# Patient Record
Sex: Female | Born: 1955 | Race: White | Hispanic: No | Marital: Married | Smoking: Never smoker
Health system: Southern US, Community
[De-identification: ages and names within clinical notes are randomized; demographics above are authoritative.]

## PROBLEM LIST (undated history)

## (undated) HISTORY — PX: TUBAL LIGATION: SHX77

---

## 1999-03-27 ENCOUNTER — Ambulatory Visit (HOSPITAL_COMMUNITY): Admission: RE | Admit: 1999-03-27 | Discharge: 1999-03-27 | Payer: Self-pay | Admitting: Obstetrics & Gynecology

## 1999-03-27 ENCOUNTER — Encounter (INDEPENDENT_AMBULATORY_CARE_PROVIDER_SITE_OTHER): Payer: Self-pay | Admitting: Specialist

## 2000-04-13 ENCOUNTER — Other Ambulatory Visit: Admission: RE | Admit: 2000-04-13 | Discharge: 2000-04-13 | Payer: Self-pay | Admitting: Obstetrics & Gynecology

## 2001-06-16 ENCOUNTER — Other Ambulatory Visit: Admission: RE | Admit: 2001-06-16 | Discharge: 2001-06-16 | Payer: Self-pay | Admitting: Obstetrics & Gynecology

## 2002-05-16 ENCOUNTER — Other Ambulatory Visit: Admission: RE | Admit: 2002-05-16 | Discharge: 2002-05-16 | Payer: Self-pay | Admitting: Obstetrics & Gynecology

## 2003-05-04 ENCOUNTER — Other Ambulatory Visit: Admission: RE | Admit: 2003-05-04 | Discharge: 2003-05-04 | Payer: Self-pay | Admitting: Obstetrics & Gynecology

## 2004-06-03 ENCOUNTER — Other Ambulatory Visit: Admission: RE | Admit: 2004-06-03 | Discharge: 2004-06-03 | Payer: Self-pay | Admitting: Obstetrics & Gynecology

## 2005-06-07 ENCOUNTER — Other Ambulatory Visit: Admission: RE | Admit: 2005-06-07 | Discharge: 2005-06-07 | Payer: Self-pay | Admitting: Obstetrics & Gynecology

## 2009-11-18 ENCOUNTER — Encounter: Admission: RE | Admit: 2009-11-18 | Discharge: 2009-11-18 | Payer: Self-pay | Admitting: Obstetrics and Gynecology

## 2009-12-15 ENCOUNTER — Ambulatory Visit (HOSPITAL_COMMUNITY): Admission: RE | Admit: 2009-12-15 | Discharge: 2009-12-15 | Payer: Self-pay | Admitting: Obstetrics & Gynecology

## 2010-12-02 ENCOUNTER — Other Ambulatory Visit: Payer: Self-pay | Admitting: Obstetrics & Gynecology

## 2010-12-14 LAB — CBC
HCT: 42.2 % (ref 36.0–46.0)
RBC: 4.75 MIL/uL (ref 3.87–5.11)
WBC: 6.2 10*3/uL (ref 4.0–10.5)

## 2015-01-21 ENCOUNTER — Other Ambulatory Visit: Payer: Self-pay

## 2015-01-23 LAB — CYTOLOGY - PAP

## 2017-06-21 ENCOUNTER — Other Ambulatory Visit: Payer: Self-pay | Admitting: Family Medicine

## 2017-06-21 ENCOUNTER — Ambulatory Visit
Admission: RE | Admit: 2017-06-21 | Discharge: 2017-06-21 | Disposition: A | Discharge status: Home / Self Care | Payer: BC Managed Care – PPO | Source: Ambulatory Visit | Attending: Family Medicine | Admitting: Family Medicine

## 2017-06-21 DIAGNOSIS — M25562 Pain in left knee: Secondary | ICD-10-CM

## 2017-09-21 ENCOUNTER — Other Ambulatory Visit: Payer: Self-pay

## 2017-09-21 ENCOUNTER — Encounter (HOSPITAL_BASED_OUTPATIENT_CLINIC_OR_DEPARTMENT_OTHER): Payer: Self-pay

## 2017-09-21 ENCOUNTER — Emergency Department (HOSPITAL_BASED_OUTPATIENT_CLINIC_OR_DEPARTMENT_OTHER)
Admission: EM | Admit: 2017-09-21 | Discharge: 2017-09-22 | Disposition: A | Discharge status: Home / Self Care | Payer: BC Managed Care – PPO | Attending: Emergency Medicine | Admitting: Emergency Medicine

## 2017-09-21 DIAGNOSIS — S298XXA Other specified injuries of thorax, initial encounter: Secondary | ICD-10-CM | POA: Diagnosis not present

## 2017-09-21 DIAGNOSIS — S29001A Unspecified injury of muscle and tendon of front wall of thorax, initial encounter: Secondary | ICD-10-CM | POA: Diagnosis present

## 2017-09-21 DIAGNOSIS — Y92410 Unspecified street and highway as the place of occurrence of the external cause: Secondary | ICD-10-CM | POA: Diagnosis not present

## 2017-09-21 DIAGNOSIS — Y999 Unspecified external cause status: Secondary | ICD-10-CM | POA: Insufficient documentation

## 2017-09-21 DIAGNOSIS — Y939 Activity, unspecified: Secondary | ICD-10-CM | POA: Insufficient documentation
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
## 2017-09-22 ENCOUNTER — Emergency Department (HOSPITAL_BASED_OUTPATIENT_CLINIC_OR_DEPARTMENT_OTHER): Payer: BC Managed Care – PPO

## 2017-09-22 NOTE — ED Notes (Signed)
ED Provider at bedside. 
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
## 2021-05-05 ENCOUNTER — Other Ambulatory Visit: Payer: Self-pay | Admitting: Family Medicine

## 2021-05-05 DIAGNOSIS — Z1231 Encounter for screening mammogram for malignant neoplasm of breast: Secondary | ICD-10-CM

## 2021-05-27 ENCOUNTER — Other Ambulatory Visit: Payer: Self-pay

## 2021-05-27 ENCOUNTER — Ambulatory Visit
Admission: RE | Admit: 2021-05-27 | Discharge: 2021-05-27 | Disposition: A | Discharge status: Home / Self Care | Payer: BC Managed Care – PPO | Source: Ambulatory Visit | Attending: Family Medicine | Admitting: Family Medicine

## 2021-05-27 DIAGNOSIS — Z1231 Encounter for screening mammogram for malignant neoplasm of breast: Secondary | ICD-10-CM

## 2022-11-03 IMAGING — MG MM DIGITAL SCREENING BILAT W/ TOMO AND CAD
8 series · 9 of 24 positions shown · non-contrast
Comparison: Previous exam(s).

CLINICAL DATA: Screening.

EXAM:
DIGITAL SCREENING BILATERAL MAMMOGRAM WITH TOMOSYNTHESIS AND CAD
TECHNIQUE: Bilateral screening digital craniocaudal and mediolateral oblique
mammograms were obtained. Bilateral screening digital breast
tomosynthesis was performed. The images were evaluated with
computer-aided detection.

[L CC synth-2D]
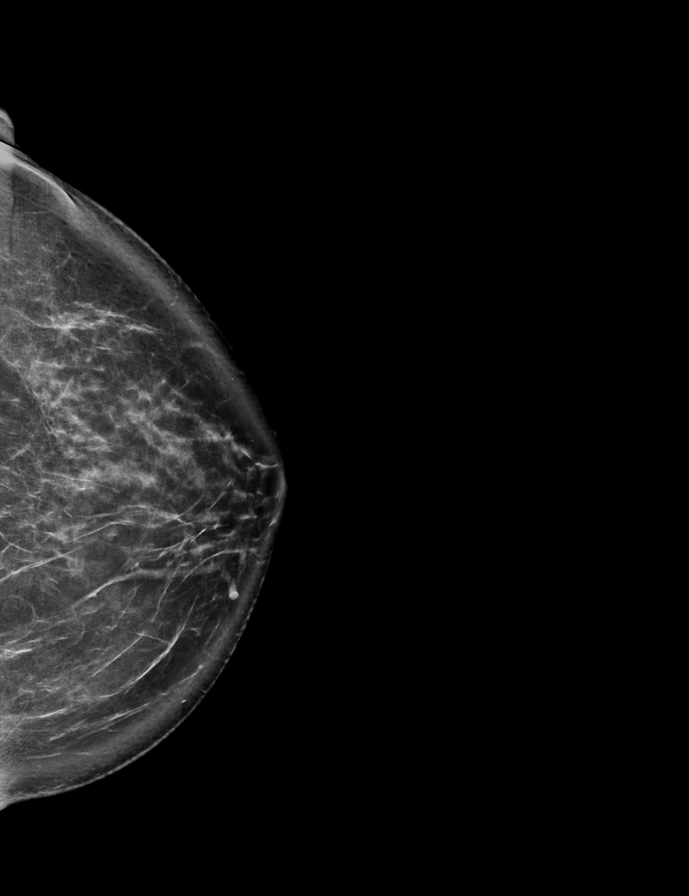

[R MLO synth-2D]
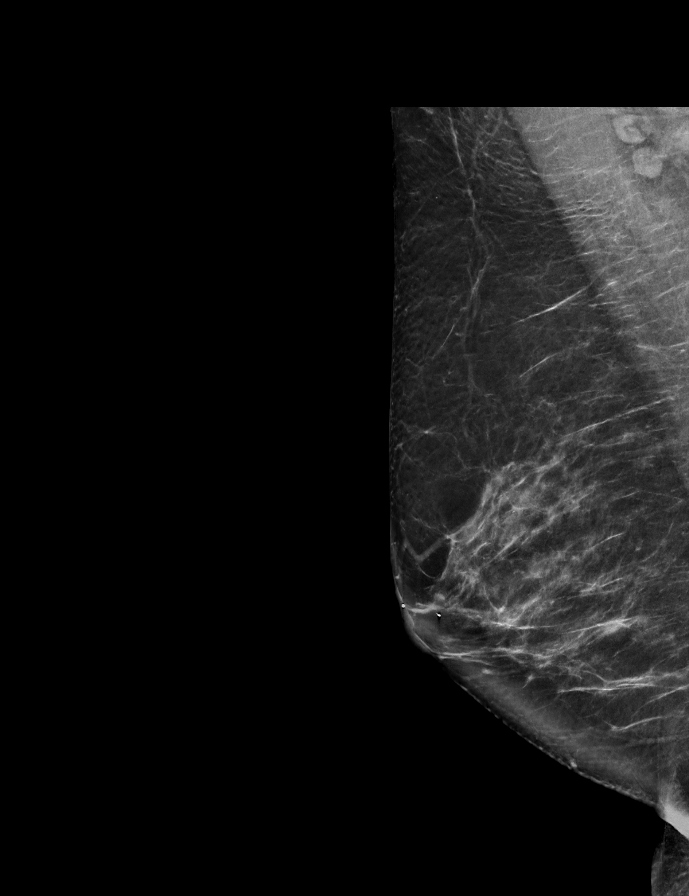

[R CC synth-2D]
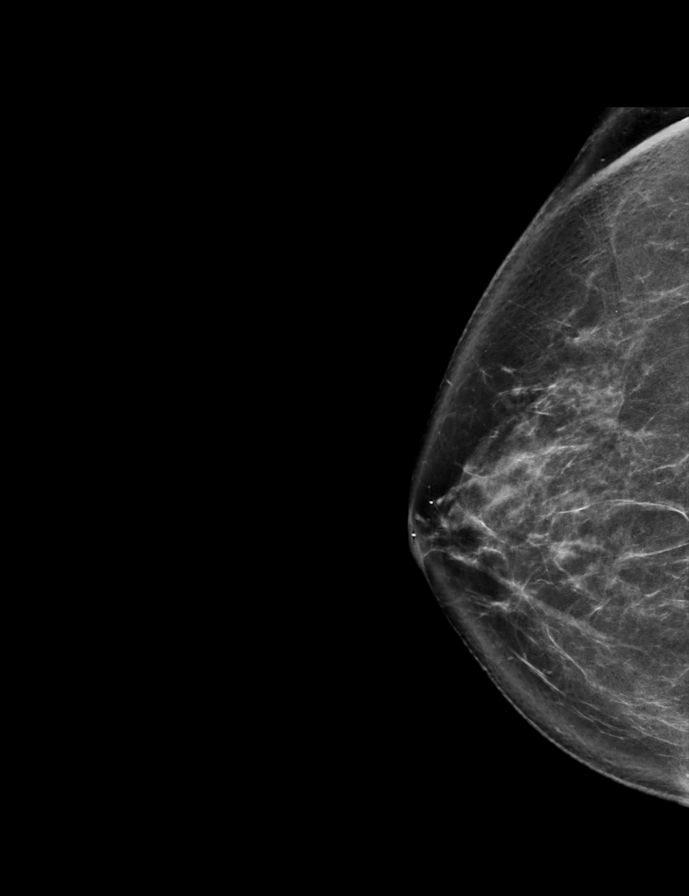

[L MLO synth-2D]
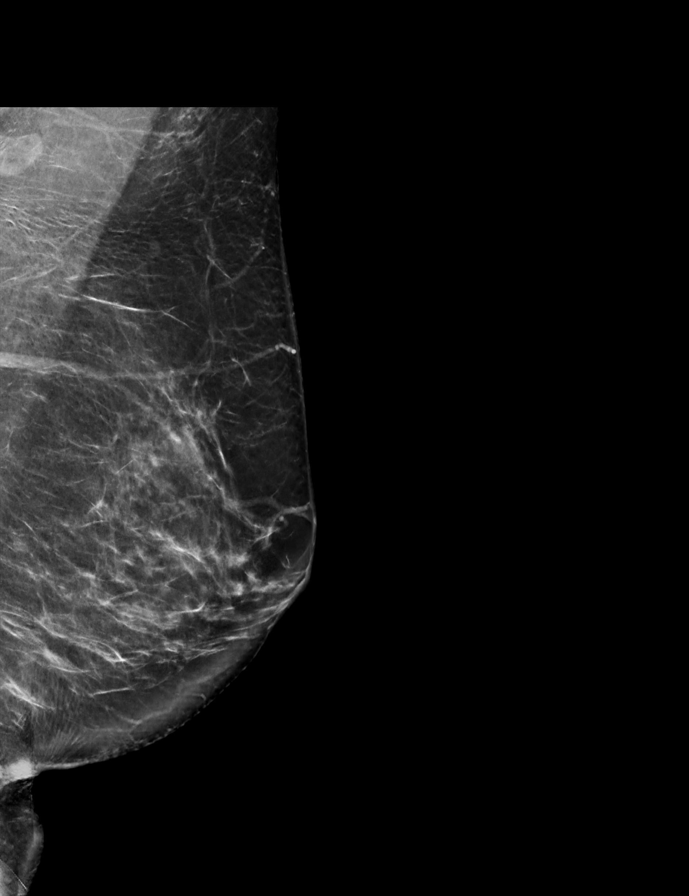

[R MLO tomo · 2 of 78 frames shown]
[frame 26/78]
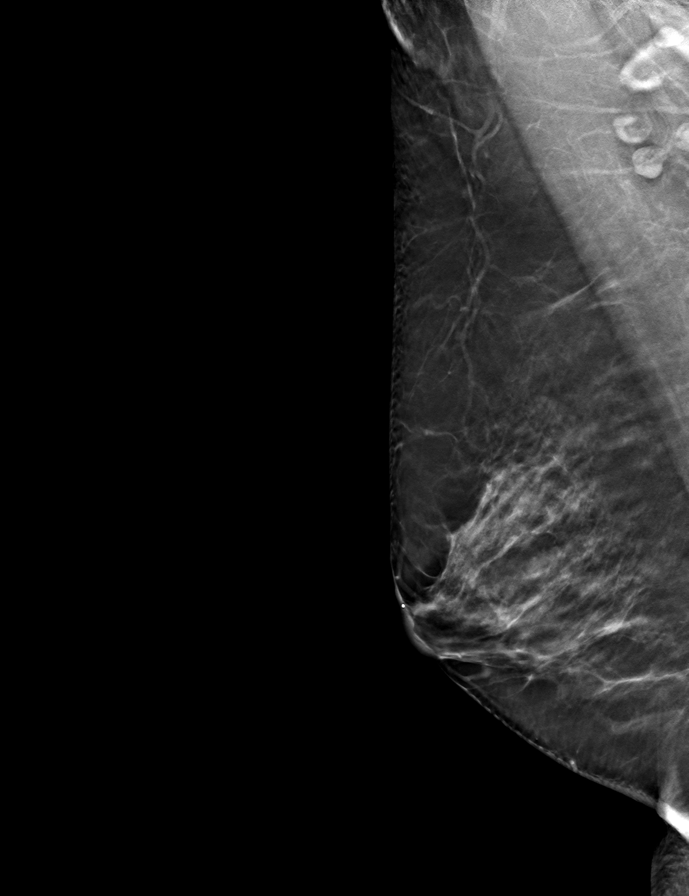
[frame 39/78]
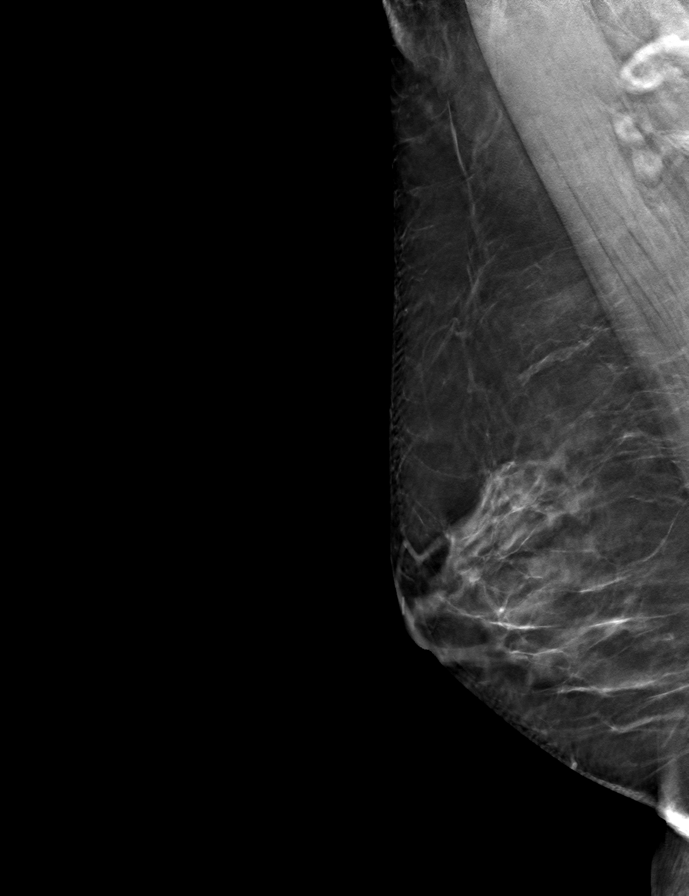

[L MLO tomo · tomo slice 44/87.0]
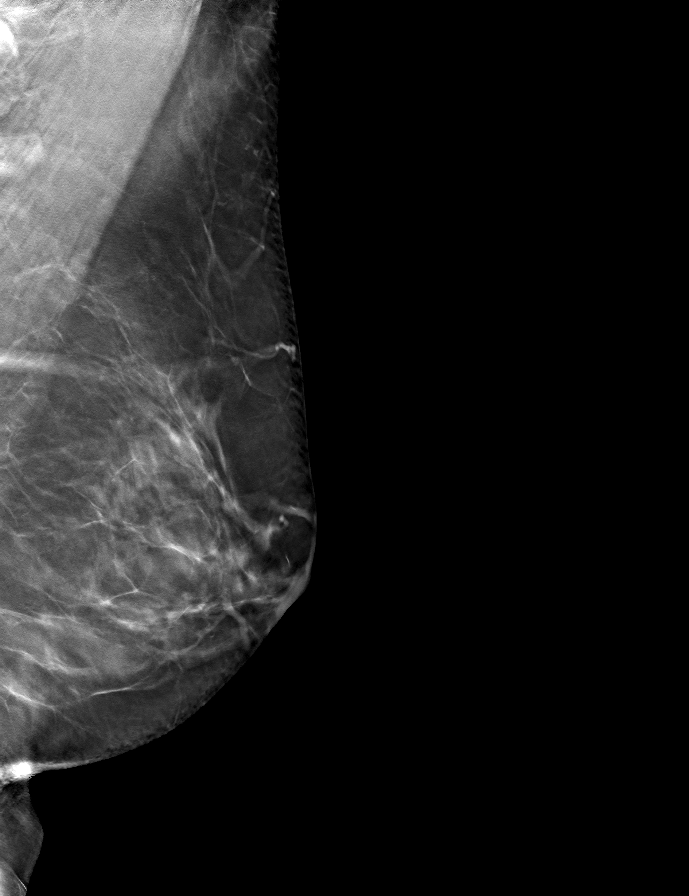

[L CC tomo · tomo slice 43/84.0]
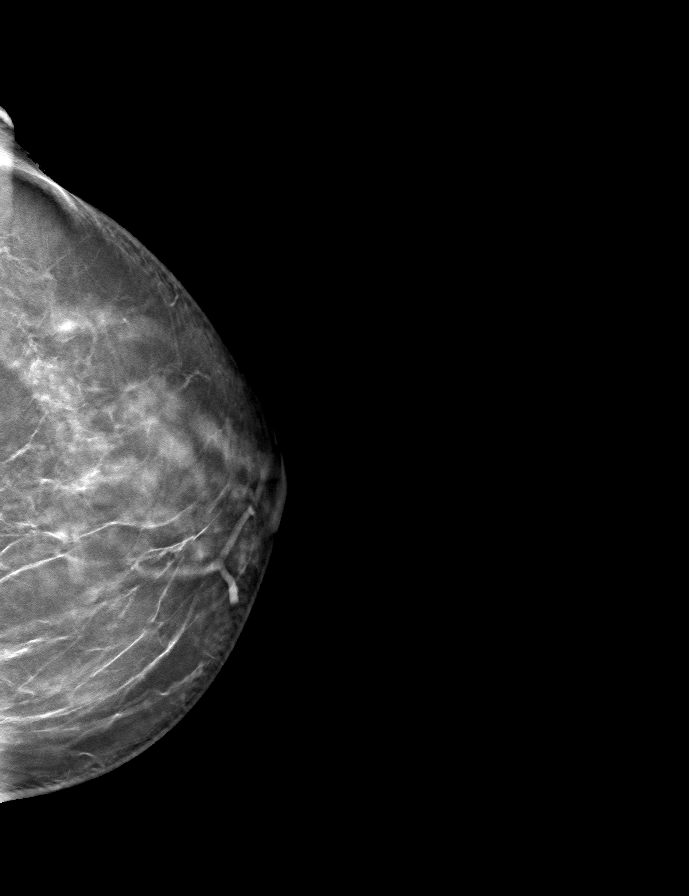

[R CC tomo · tomo slice 41/80.0]
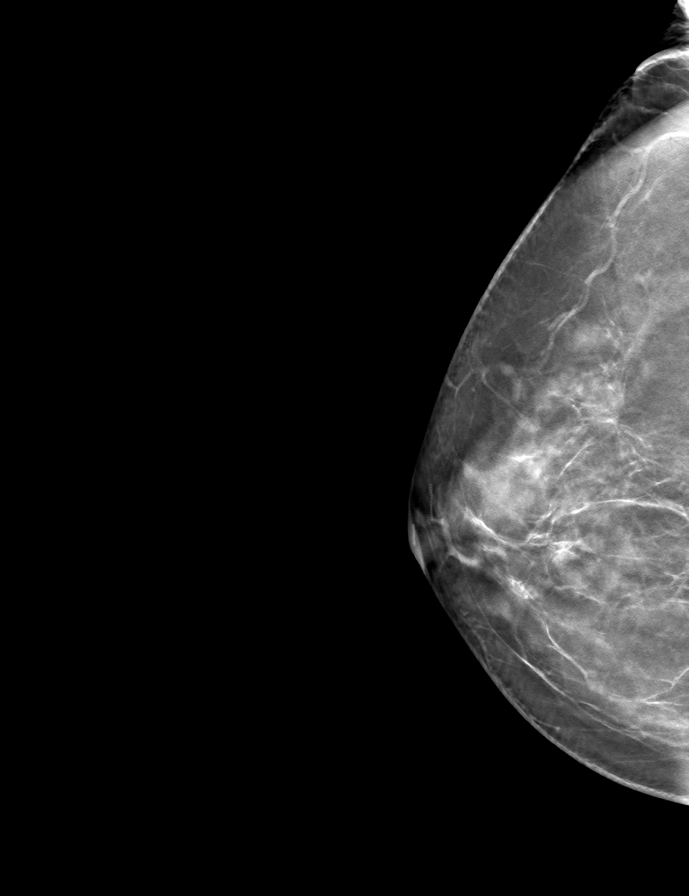

[9 of 24 positions shown; findings below may reference images not displayed]

ACR Breast Density Category c: The breast tissue is heterogeneously
dense, which may obscure small masses.
FINDINGS: There are no findings suspicious for malignancy.
IMPRESSION: No mammographic evidence of malignancy. A result letter of this
screening mammogram will be mailed directly to the patient.

RECOMMENDATION:
Screening mammogram in one year. (Code:Q3-W-BC3)

BI-RADS CATEGORY  1: Negative.

## 2023-01-31 ENCOUNTER — Other Ambulatory Visit (HOSPITAL_COMMUNITY): Payer: Self-pay | Admitting: Family Medicine

## 2023-01-31 DIAGNOSIS — E782 Mixed hyperlipidemia: Secondary | ICD-10-CM

## 2023-02-16 ENCOUNTER — Ambulatory Visit (HOSPITAL_COMMUNITY)
Admission: RE | Admit: 2023-02-16 | Discharge: 2023-02-16 | Disposition: A | Discharge status: Home / Self Care | Payer: BC Managed Care – PPO | Source: Ambulatory Visit | Attending: Family Medicine | Admitting: Family Medicine

## 2023-02-16 DIAGNOSIS — E782 Mixed hyperlipidemia: Secondary | ICD-10-CM | POA: Insufficient documentation
